# Patient Record
Sex: Male | Born: 1975 | Race: Black or African American | Hispanic: No | Marital: Single | Smoking: Former smoker
Health system: Southern US, Community
[De-identification: ages and names within clinical notes are randomized; demographics above are authoritative.]

## PROBLEM LIST (undated history)

## (undated) DIAGNOSIS — J45909 Unspecified asthma, uncomplicated: Secondary | ICD-10-CM

## (undated) DIAGNOSIS — I10 Essential (primary) hypertension: Secondary | ICD-10-CM

## (undated) HISTORY — DX: Essential (primary) hypertension: I10

## (undated) HISTORY — DX: Unspecified asthma, uncomplicated: J45.909

---

## 1998-02-26 ENCOUNTER — Emergency Department (HOSPITAL_COMMUNITY): Admission: EM | Admit: 1998-02-26 | Discharge: 1998-02-26 | Payer: Self-pay | Admitting: Emergency Medicine

## 1998-04-30 ENCOUNTER — Emergency Department (HOSPITAL_COMMUNITY): Admission: EM | Admit: 1998-04-30 | Discharge: 1998-04-30 | Payer: Self-pay | Admitting: Endocrinology

## 1998-05-06 ENCOUNTER — Emergency Department (HOSPITAL_COMMUNITY): Admission: EM | Admit: 1998-05-06 | Discharge: 1998-05-06 | Payer: Self-pay | Admitting: Emergency Medicine

## 1998-06-17 ENCOUNTER — Other Ambulatory Visit: Admission: RE | Admit: 1998-06-17 | Discharge: 1998-06-17 | Payer: Self-pay | Admitting: Urology

## 1999-03-08 ENCOUNTER — Emergency Department (HOSPITAL_COMMUNITY): Admission: EM | Admit: 1999-03-08 | Discharge: 1999-03-08 | Payer: Self-pay | Admitting: Emergency Medicine

## 1999-03-09 ENCOUNTER — Emergency Department (HOSPITAL_COMMUNITY): Admission: EM | Admit: 1999-03-09 | Discharge: 1999-03-09 | Payer: Self-pay

## 1999-03-10 ENCOUNTER — Emergency Department (HOSPITAL_COMMUNITY): Admission: EM | Admit: 1999-03-10 | Discharge: 1999-03-10 | Payer: Self-pay

## 1999-04-01 ENCOUNTER — Emergency Department (HOSPITAL_COMMUNITY): Admission: EM | Admit: 1999-04-01 | Discharge: 1999-04-01 | Payer: Self-pay

## 1999-07-07 ENCOUNTER — Emergency Department (HOSPITAL_COMMUNITY): Admission: EM | Admit: 1999-07-07 | Discharge: 1999-07-07 | Payer: Self-pay | Admitting: Emergency Medicine

## 1999-10-27 ENCOUNTER — Emergency Department (HOSPITAL_COMMUNITY): Admission: EM | Admit: 1999-10-27 | Discharge: 1999-10-27 | Payer: Self-pay | Admitting: Emergency Medicine

## 1999-11-12 ENCOUNTER — Emergency Department (HOSPITAL_COMMUNITY): Admission: EM | Admit: 1999-11-12 | Discharge: 1999-11-12 | Payer: Self-pay | Admitting: Emergency Medicine

## 1999-12-17 ENCOUNTER — Encounter: Payer: Self-pay | Admitting: Emergency Medicine

## 1999-12-17 ENCOUNTER — Emergency Department (HOSPITAL_COMMUNITY): Admission: EM | Admit: 1999-12-17 | Discharge: 1999-12-18 | Payer: Self-pay | Admitting: Emergency Medicine

## 2000-03-02 ENCOUNTER — Emergency Department (HOSPITAL_COMMUNITY): Admission: EM | Admit: 2000-03-02 | Discharge: 2000-03-02 | Payer: Self-pay | Admitting: Emergency Medicine

## 2003-06-29 ENCOUNTER — Emergency Department (HOSPITAL_COMMUNITY): Admission: EM | Admit: 2003-06-29 | Discharge: 2003-06-29 | Payer: Self-pay | Admitting: Emergency Medicine

## 2003-11-16 ENCOUNTER — Emergency Department (HOSPITAL_COMMUNITY): Admission: EM | Admit: 2003-11-16 | Discharge: 2003-11-16 | Payer: Self-pay | Admitting: Emergency Medicine

## 2004-03-17 ENCOUNTER — Emergency Department (HOSPITAL_COMMUNITY): Admission: EM | Admit: 2004-03-17 | Discharge: 2004-03-17 | Payer: Self-pay | Admitting: Emergency Medicine

## 2004-09-28 ENCOUNTER — Emergency Department (HOSPITAL_COMMUNITY): Admission: EM | Admit: 2004-09-28 | Discharge: 2004-09-28 | Payer: Self-pay | Admitting: Emergency Medicine

## 2004-11-08 ENCOUNTER — Ambulatory Visit: Payer: Self-pay | Admitting: Family Medicine

## 2004-12-09 ENCOUNTER — Ambulatory Visit: Payer: Self-pay | Admitting: Family Medicine

## 2004-12-13 ENCOUNTER — Ambulatory Visit: Payer: Self-pay | Admitting: Family Medicine

## 2004-12-24 ENCOUNTER — Ambulatory Visit: Payer: Self-pay | Admitting: *Deleted

## 2005-01-13 ENCOUNTER — Ambulatory Visit: Payer: Self-pay | Admitting: Family Medicine

## 2005-02-01 ENCOUNTER — Ambulatory Visit: Payer: Self-pay | Admitting: Family Medicine

## 2005-03-22 ENCOUNTER — Emergency Department (HOSPITAL_COMMUNITY): Admission: EM | Admit: 2005-03-22 | Discharge: 2005-03-22 | Payer: Self-pay | Admitting: Emergency Medicine

## 2005-05-26 ENCOUNTER — Ambulatory Visit: Payer: Self-pay | Admitting: Family Medicine

## 2005-05-30 ENCOUNTER — Ambulatory Visit: Payer: Self-pay | Admitting: Family Medicine

## 2006-08-03 ENCOUNTER — Ambulatory Visit: Payer: Self-pay | Admitting: Family Medicine

## 2006-11-29 ENCOUNTER — Ambulatory Visit: Payer: Self-pay | Admitting: Family Medicine

## 2007-08-08 ENCOUNTER — Encounter (INDEPENDENT_AMBULATORY_CARE_PROVIDER_SITE_OTHER): Payer: Self-pay | Admitting: *Deleted

## 2007-12-24 ENCOUNTER — Ambulatory Visit: Payer: Self-pay | Admitting: Family Medicine

## 2007-12-24 LAB — CONVERTED CEMR LAB
Bilirubin Urine: NEGATIVE
Blood in Urine, dipstick: NEGATIVE
Glucose, Urine, Semiquant: NEGATIVE
Nitrite: NEGATIVE
Protein, U semiquant: NEGATIVE
Specific Gravity, Urine: >=1.03
Urobilinogen, UA: NEGATIVE
WBC Urine, dipstick: NEGATIVE
pH: 5.5

## 2007-12-25 ENCOUNTER — Encounter (INDEPENDENT_AMBULATORY_CARE_PROVIDER_SITE_OTHER): Payer: Self-pay | Admitting: Family Medicine

## 2008-12-27 ENCOUNTER — Emergency Department (HOSPITAL_COMMUNITY): Admission: EM | Admit: 2008-12-27 | Discharge: 2008-12-27 | Payer: Self-pay | Admitting: Emergency Medicine

## 2011-02-15 ENCOUNTER — Emergency Department (HOSPITAL_COMMUNITY)
Admission: EM | Admit: 2011-02-15 | Discharge: 2011-02-15 | Disposition: A | Discharge status: Home / Self Care | Payer: Self-pay | Attending: Emergency Medicine | Admitting: Emergency Medicine

## 2011-02-15 DIAGNOSIS — R209 Unspecified disturbances of skin sensation: Secondary | ICD-10-CM | POA: Insufficient documentation

## 2015-01-23 ENCOUNTER — Emergency Department (HOSPITAL_COMMUNITY)
Admission: EM | Admit: 2015-01-23 | Discharge: 2015-01-23 | Disposition: A | Discharge status: Home / Self Care | Payer: Self-pay | Source: Home / Self Care

## 2015-06-07 ENCOUNTER — Ambulatory Visit (INDEPENDENT_AMBULATORY_CARE_PROVIDER_SITE_OTHER): Payer: No Typology Code available for payment source

## 2015-06-07 ENCOUNTER — Ambulatory Visit (INDEPENDENT_AMBULATORY_CARE_PROVIDER_SITE_OTHER): Payer: No Typology Code available for payment source | Admitting: Internal Medicine

## 2015-06-07 VITALS — BP 128/72 | HR 61 | Temp 97.7°F | Resp 18 | Ht 67.0 in | Wt 165.0 lb

## 2015-06-07 DIAGNOSIS — Z87891 Personal history of nicotine dependence: Secondary | ICD-10-CM | POA: Insufficient documentation

## 2015-06-07 DIAGNOSIS — Z114 Encounter for screening for human immunodeficiency virus [HIV]: Secondary | ICD-10-CM

## 2015-06-07 DIAGNOSIS — R1032 Left lower quadrant pain: Secondary | ICD-10-CM

## 2015-06-07 DIAGNOSIS — N419 Inflammatory disease of prostate, unspecified: Secondary | ICD-10-CM | POA: Diagnosis not present

## 2015-06-07 DIAGNOSIS — Z23 Encounter for immunization: Secondary | ICD-10-CM | POA: Diagnosis not present

## 2015-06-07 DIAGNOSIS — I10 Essential (primary) hypertension: Secondary | ICD-10-CM | POA: Insufficient documentation

## 2015-06-07 DIAGNOSIS — R109 Unspecified abdominal pain: Secondary | ICD-10-CM | POA: Diagnosis not present

## 2015-06-07 LAB — POCT URINALYSIS DIPSTICK
Bilirubin, UA: NEGATIVE
Blood, UA: NEGATIVE
Glucose, UA: NEGATIVE
Ketones, UA: NEGATIVE
Leukocytes, UA: NEGATIVE
Nitrite, UA: NEGATIVE
Protein, UA: NEGATIVE
Spec Grav, UA: 1.01
Urobilinogen, UA: 0.2
pH, UA: 6

## 2015-06-07 LAB — POCT CBC
Granulocyte percent: 44.8 %G (ref 37–80)
HCT, POC: 46.3 % (ref 43.5–53.7)
Hemoglobin: 15.6 g/dL (ref 14.1–18.1)
Lymph, poc: 2.6 (ref 0.6–3.4)
MCH, POC: 31 pg (ref 27–31.2)
MCHC: 33.6 g/dL (ref 31.8–35.4)
MCV: 92.3 fL (ref 80–97)
MID (cbc): 0.5 (ref 0–0.9)
MPV: 7.7 fL (ref 0–99.8)
POC Granulocyte: 2.5 (ref 2–6.9)
POC LYMPH PERCENT: 46.7 %L (ref 10–50)
POC MID %: 8.5 %M (ref 0–12)
Platelet Count, POC: 219 10*3/uL (ref 142–424)
RBC: 5.02 M/uL (ref 4.69–6.13)
RDW, POC: 13.7 %
WBC: 5.5 10*3/uL (ref 4.6–10.2)

## 2015-06-07 LAB — POCT UA - MICROSCOPIC ONLY
Bacteria, U Microscopic: NEGATIVE
Casts, Ur, LPF, POC: NEGATIVE
Crystals, Ur, HPF, POC: NEGATIVE
Epithelial cells, urine per micros: NEGATIVE
Mucus, UA: NEGATIVE
RBC, urine, microscopic: NEGATIVE
WBC, Ur, HPF, POC: NEGATIVE
Yeast, UA: NEGATIVE

## 2015-06-07 LAB — HIV ANTIBODY (ROUTINE TESTING W REFLEX): HIV: NONREACTIVE

## 2015-06-07 MED ORDER — SULFAMETHOXAZOLE-TRIMETHOPRIM 800-160 MG PO TABS: 1.0000 | ORAL_TABLET | Freq: Two times a day (BID) | ORAL | Status: AC

## 2015-06-07 NOTE — Progress Notes (Signed)
Patient ID: Nicholas Mata, male    DOB: 1976/03/19, 39 y.o.   MRN: [id number]  PCP: Dorrene German, MD  Subjective:   Chief Complaint  Patient presents with  . Back Pain    lower back pain, 1 month  . Abdominal Pain    1 month     HPI Presents for evaluation of left lower abdominal and left lower back pain.  X 1 month. Too busy with work to come in. About 2 months ago saw PCP with similar complaints and prostate was swollen and given antibiotics. Symptoms resolved. No dysuria, urinary urgency or frequency. No hematuria. No penile discharge. Feels like he has incomplete bladder emptying. Notes that the pain is worse when he needs to have a BM or urinate. No fever, chills, nausea, vomiting or diarrhea. Reports regular BMs and a daily pro-biotic. No unexplained muscle or joint pain.  One sexual partner. Consistent condom use.  No known family history of prostate or colon cancer.   Review of Systems  Constitutional: Negative.   Eyes: Negative for visual disturbance.  Respiratory: Negative for cough, chest tightness and wheezing.   Cardiovascular: Negative for chest pain, palpitations and leg swelling.  Gastrointestinal: Positive for abdominal pain. Negative for nausea, vomiting, diarrhea, constipation, blood in stool, abdominal distention, anal bleeding and rectal pain.  Endocrine: Negative for cold intolerance and polyuria.  Genitourinary: Positive for flank pain. Negative for dysuria, urgency, frequency, hematuria, decreased urine volume, discharge, penile swelling, scrotal swelling, genital sores, penile pain and testicular pain. Difficulty urinating: feels incompletely empties bladder.  Musculoskeletal: Positive for back pain. Negative for myalgias, joint swelling, arthralgias, gait problem, neck pain and neck stiffness.  Skin: Negative for rash and wound.  Neurological: Negative for dizziness, weakness and headaches.  Hematological: Negative for adenopathy. Does not  bruise/bleed easily.  Psychiatric/Behavioral: Negative for sleep disturbance and dysphoric mood. The patient is not nervous/anxious.         Patient Active Problem List   Diagnosis Date Noted  . Benign essential HTN 06/07/2015  . Former smoker 06/07/2015     Prior to Admission medications   Medication Sig Start Date End Date Taking? Authorizing Provider  lisinopril (PRINIVIL,ZESTRIL) 10 MG tablet Take 10 mg by mouth daily.    Historical Provider, MD     No Known Allergies     Objective:  Physical Exam  Constitutional: He is oriented to person, place, and time. Vital signs are normal. He appears well-developed and well-nourished. He is active and cooperative. No distress.  BP 128/72 mmHg  Pulse 61  Temp(Src) 97.7 F (36.5 C) (Oral)  Resp 18  Ht  (1.702 m)  Wt 165 lb (74.844 kg)  BMI 25.84 kg/m2  SpO2 99%  HENT:  Head: Normocephalic and atraumatic.  Right Ear: Hearing normal.  Left Ear: Hearing normal.  Eyes: Conjunctivae are normal. No scleral icterus.  Neck: Normal range of motion. Neck supple. No thyromegaly present.  Cardiovascular: Normal rate, regular rhythm and normal heart sounds.   Pulses:      Radial pulses are 2+ on the right side, and 2+ on the left side.  Pulmonary/Chest: Effort normal and breath sounds normal.  Abdominal: Soft. Normal appearance. Bowel sounds are increased. There is no hepatosplenomegaly. There is generalized tenderness. There is no rigidity, no rebound, no guarding, no CVA tenderness, no tenderness at McBurney's point and negative Murphy's sign. No hernia. Hernia confirmed negative in the right inguinal area and confirmed negative in the left inguinal area.  Genitourinary: Rectum normal, testes normal and penis normal. Prostate is tender. Prostate is not enlarged. Circumcised.  Musculoskeletal:       Cervical back: Normal.       Thoracic back: Normal.       Lumbar back: Normal.  Lymphadenopathy:       Head (right side): No  tonsillar, no preauricular, no posterior auricular and no occipital adenopathy present.       Head (left side): No tonsillar, no preauricular, no posterior auricular and no occipital adenopathy present.    He has no cervical adenopathy.       Right: No inguinal and no supraclavicular adenopathy present.       Left: No inguinal and no supraclavicular adenopathy present.  Neurological: He is alert and oriented to person, place, and time. No sensory deficit.  Skin: Skin is warm, dry and intact. No rash noted. No cyanosis or erythema. Nails show no clubbing.  Psychiatric: He has a normal mood and affect. His speech is normal and behavior is normal.       Results for orders placed or performed in visit on 06/07/15  POCT CBC  Result Value Ref Range   WBC 5.5 4.6 - 10.2 K/uL   Lymph, poc 2.6 0.6 - 3.4   POC LYMPH PERCENT 46.7 10 - 50 %L   MID (cbc) 0.5 0 - 0.9   POC MID % 8.5 0 - 12 %M   POC Granulocyte 2.5 2 - 6.9   Granulocyte percent 44.8 37 - 80 %G   RBC 5.02 4.69 - 6.13 M/uL   Hemoglobin 15.6 14.1 - 18.1 g/dL   HCT, POC 16.1 09.6 - 53.7 %   MCV 92.3 80 - 97 fL   MCH, POC 31.0 27 - 31.2 pg   MCHC 33.6 31.8 - 35.4 g/dL   RDW, POC 04.5 %   Platelet Count, POC 219 142 - 424 K/uL   MPV 7.7 0 - 99.8 fL  POCT UA - Microscopic Only  Result Value Ref Range   WBC, Ur, HPF, POC neg    RBC, urine, microscopic neg    Bacteria, U Microscopic neg    Mucus, UA neg    Epithelial cells, urine per micros neg    Crystals, Ur, HPF, POC neg    Casts, Ur, LPF, POC neg    Yeast, UA neg   POCT urinalysis dipstick  Result Value Ref Range   Color, UA yellow    Clarity, UA clear    Glucose, UA neg    Bilirubin, UA neg    Ketones, UA neg    Spec Grav, UA 1.010    Blood, UA neg    pH, UA 6.0    Protein, UA neg    Urobilinogen, UA 0.2    Nitrite, UA neg    Leukocytes, UA Negative Negative   Acute Abdominal Series: [facility] reading (PRIMARY) by  Dr. Merla Riches. Increase air in the colon,  especially LUQ. Non-specific bowel gas pattern. No free air. No evidence of mass or obstruction.      Assessment & Plan:   1. Flank pain 2. Abdominal pain, LLQ (left lower quadrant) Doubt this is acute prostatitis, but given prostate tenderness, treat as below. Suspect chronic subacute constipation causing pain and preventing complete bladder emptying. Miralax daily this week, and if stool not very loose by Friday, Miralax clean out. If symptoms persist, or resolve and recur, would recommend GI evaluation. - POCT UA - Microscopic Only - POCT urinalysis dipstick -  POCT CBC - DG Abd Acute W/Chest - GC/Chlamydia Probe Amp  3. Screening for HIV (human immunodeficiency virus) - HIV antibody  4. Need for Tdap vaccination - Tdap vaccine greater than or equal to 7yo IM  5. Prostatitis, unspecified prostatitis type UA normal, but prostate is tender. Not enlarged today but reportedly enlarged previously with similar symptoms. Consider BPH and the addition of Flomax. - sulfamethoxazole-trimethoprim (BACTRIM DS,SEPTRA DS) 800-160 MG per tablet; Take 1 tablet by mouth 2 (two) times daily.  Dispense: 28 tablet; Refill: 0   Fernande Brashelle S. Adisa Litt, PA-C Physician Assistant-Certified [facility]  I have participated in the care of this patient with the Advanced Practice Provider and agree with Diagnosis and Plan as documented. Robert P. Merla Richesoolittle, M.D.

## 2015-06-07 NOTE — Patient Instructions (Signed)
HOW TO GET YOUR KID UNCLOGGED ASAP (copied from It's No Accident by Dr. Yetta FlockHodges, pp. [id number])  Initial Clean Out: Start this process on a Friday night, so you have the entire weekend to get the job done. For children lighter than 45 pounds, mix seven doses of MiraLAX or generic brand powder (one dose is a cap filled to the line) in 32 ounces of Gatorade, Pedialyte, or other clear, noncarbonated liquid. Gatorade and Pedialyte help prevent dehydration because they contain electrolytes.  Plus, they taste good, so kids may be more motivated to drink the full dose.  Have your child drink the entire bottle over twenty-four hours. She can eat anything she wants, but should drink only this fluid. Kids weighing between forty-five and eighty pounds should get ten doses, and those weighing at least eighty pounds should receive fourteen doses of laxative in 64 ounces of fluid over the first twenty-four hours.  Don't give her less than this amount! You may even need to give her more.  The endpoint you're shooting for is watery poop.  Your child should pass five or six stools within twenty-four to forty-eight hours.  If she doesn't, keep her on the maintenance dose described below and try the initial clean out program again the next weekend, keeping her on a daily capful of MiraLAX during the week.  Maintenance: Following the bowel clearing, give your child one cap of the laxative in 8 ounces of fluid daily, and have her sit on the toilet after all meals, especially breakfast, using a footstool for support if necessary.  She should blow out while pooping, which will help her bottom muscles relax.

## 2015-06-10 LAB — GC/CHLAMYDIA PROBE AMP
CT Probe RNA: NEGATIVE
GC Probe RNA: NEGATIVE

## 2016-03-04 ENCOUNTER — Ambulatory Visit (INDEPENDENT_AMBULATORY_CARE_PROVIDER_SITE_OTHER): Payer: 59 | Admitting: Physician Assistant

## 2016-03-04 ENCOUNTER — Ambulatory Visit (INDEPENDENT_AMBULATORY_CARE_PROVIDER_SITE_OTHER): Payer: 59

## 2016-03-04 ENCOUNTER — Other Ambulatory Visit: Payer: Self-pay | Admitting: Physician Assistant

## 2016-03-04 VITALS — BP 142/102 | HR 63 | Temp 98.0°F | Resp 16 | Ht 67.0 in | Wt 167.6 lb

## 2016-03-04 DIAGNOSIS — R1032 Left lower quadrant pain: Secondary | ICD-10-CM

## 2016-03-04 DIAGNOSIS — R3989 Other symptoms and signs involving the genitourinary system: Secondary | ICD-10-CM

## 2016-03-04 DIAGNOSIS — K5900 Constipation, unspecified: Secondary | ICD-10-CM

## 2016-03-04 DIAGNOSIS — I10 Essential (primary) hypertension: Secondary | ICD-10-CM

## 2016-03-04 DIAGNOSIS — N419 Inflammatory disease of prostate, unspecified: Secondary | ICD-10-CM

## 2016-03-04 DIAGNOSIS — N3289 Other specified disorders of bladder: Secondary | ICD-10-CM

## 2016-03-04 LAB — POCT URINALYSIS DIP (MANUAL ENTRY)
Bilirubin, UA: NEGATIVE
Blood, UA: NEGATIVE
GLUCOSE UA: NEGATIVE
Leukocytes, UA: NEGATIVE
NITRITE UA: NEGATIVE
Protein Ur, POC: NEGATIVE
Spec Grav, UA: 1.02
Urobilinogen, UA: 0.2
pH, UA: 6

## 2016-03-04 LAB — CBC WITH DIFFERENTIAL/PLATELET
BASOS PCT: 1 %
Basophils Absolute: 56 cells/uL (ref 0–200)
EOS ABS: 224 {cells}/uL (ref 15–500)
Eosinophils Relative: 4 %
HEMATOCRIT: 47.9 % (ref 38.5–50.0)
HEMOGLOBIN: 16 g/dL (ref 13.2–17.1)
LYMPHS ABS: 1960 {cells}/uL (ref 850–3900)
LYMPHS PCT: 35 %
MCH: 31.9 pg (ref 27.0–33.0)
MCHC: 33.4 g/dL (ref 32.0–36.0)
MCV: 95.6 fL (ref 80.0–100.0)
MONO ABS: 784 {cells}/uL (ref 200–950)
MPV: 10.2 fL (ref 7.5–12.5)
Monocytes Relative: 14 %
Neutro Abs: 2576 cells/uL (ref 1500–7800)
Neutrophils Relative %: 46 %
Platelets: 239 10*3/uL (ref 140–400)
RBC: 5.01 MIL/uL (ref 4.20–5.80)
RDW: 13.9 % (ref 11.0–15.0)
WBC: 5.6 10*3/uL (ref 3.8–10.8)

## 2016-03-04 MED ORDER — SULFAMETHOXAZOLE-TRIMETHOPRIM 800-160 MG PO TABS: 1.0000 | ORAL_TABLET | Freq: Two times a day (BID) | ORAL | Status: AC

## 2016-03-04 MED ORDER — LISINOPRIL 10 MG PO TABS: 10.0000 mg | ORAL_TABLET | Freq: Every day | ORAL | Status: DC

## 2016-03-04 NOTE — Patient Instructions (Addendum)
Metamucil - daily fiber to help with constipation  If your abd pain     IF you received an x-ray today, you will receive an invoice from [facility]. Please contact [facility] at [phone] with questions or concerns regarding your invoice.   IF you received labwork today, you will receive an invoice from [facility]. Please contact [facility] at [phone] with questions or concerns regarding your invoice.   Our billing staff will not be able to assist you with questions regarding bills from these companies.  You will be contacted with the lab results as soon as they are available. The fastest way to get your results is to activate your My Chart account. Instructions are located on the last page of this paperwork. If you have not heard from us regarding the results in 2 weeks, please contact this office.

## 2016-03-04 NOTE — Progress Notes (Signed)
Nicholas PersonsLevon R Mata  MRN: [id number] DOB: [birth year]  Subjective:  Pt presents to clinic with similar symptoms that he had 05/2015 for the last 6 days, back pain and bladder pressure.  He feels the same as when his symptoms started 6 days ago.   He has had problems with constipation over the last week and the back pain seems related to that.  When he has a BM the back pain resolves and then slowly comes back.  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   Over the last 6 days he has had a different sensation when he has a full bladder.  His stream is normal but there is a pressure in the bladder and behind it.  When he has the pressure he gets a slight amount of LLQ pain that is relieved after he urinates.  He is having no testicular pain.  No F/C, no dysuria or urinary frequency.  No new sexual partners.  No change in urine stream or hesitancy.  No penile discharge.  He has known HTN and he has medication at home but he does not use the medication.  Patient Active Problem List   Diagnosis Date Noted  . Benign essential HTN 06/07/2015  . Former smoker 06/07/2015    No current outpatient prescriptions on file prior to visit.   No current facility-administered medications on file prior to visit.    No Known Allergies  Review of Systems  Constitutional: Negative for fever and chills.  Gastrointestinal: Positive for abdominal pain (LLQ pain - relieved with urinartion and a BM) and constipation.  Genitourinary: Negative for dysuria, frequency, discharge, scrotal swelling and testicular pain.   Objective:  BP 142/102 mmHg  Pulse 63  Temp(Src) 98 F (36.7 C) (Oral)  Resp 16  Ht 5\' 7"  (1.702 m)  Wt 167 lb 9.6 oz (76.023 kg)  BMI 26.24 kg/m2  SpO2 99%  Physical Exam  Constitutional: He is oriented to person, place, and time and well-developed, well-nourished, and in no distress.  HENT:  Head: Normocephalic and atraumatic.  Right Ear:  External ear normal.  Left Ear: External ear normal.  Eyes: Conjunctivae are normal.  Neck: Normal range of motion.  Cardiovascular: Normal rate, regular rhythm and normal heart sounds.   No murmur heard. Pulmonary/Chest: Effort normal and breath sounds normal. He has no wheezes.  Genitourinary: Testes/scrotum normal. Prostate is enlarged and tender (mild). He exhibits no abnormal testicular mass, no testicular tenderness, no abnormal scrotal mass, no scrotal tenderness and no epididymal tenderness.  Neurological: He is alert and oriented to person, place, and time. Gait normal.  Skin: Skin is warm and dry.  Psychiatric: Mood, memory, affect and judgment normal.   Dg Abd 1 View  03/04/2016  CLINICAL DATA:  Left lower quadrant pain . EXAM: ABDOMEN - 1 VIEW COMPARISON:  06/07/2015. FINDINGS: Soft tissue structures are unremarkable. Dilated air-filled loops of small bowel noted. Colonic gas pattern is normal. These findings suggest possibility of partial small bowel obstruction. Follow-up exam suggested for continued evaluation. No acute bony abnormality. IMPRESSION: Dilated loops of small bowel. Colonic gas pattern is normal. These findings suggest partial small bowel obstruction. Follow-up exam suggested for continued evaluation. Electronically Signed   By: Maisie Fushomas  Register   On: 03/04/2016 09:54    Assessment and Plan :  LLQ pain - Plan: DG Abd 1 View, COMPLETE METABOLIC PANEL WITH GFR, Care order/instruction - pt will be  mindful of his pain and if it changes - if this does not improve he will need to be recheck due to imaging results of possible early small bowel obstruction  Sensation of pressure in bladder area - Plan: GC/Chlamydia Probe Amp, PSA, CBC with Differential/Platelet, POCT urinalysis dipstick - will treat for prostatitis while waiting on lab results to return  Prostatitis, unspecified prostatitis type - Plan: sulfamethoxazole-trimethoprim (BACTRIM DS,SEPTRA DS) 800-160 MG  tablet  Constipation, unspecified constipation type - he will start a daily fiber supplement - it is interesting that his symptoms seem to start with constipation so we will work on getting this from happening in the future  Essential hypertension - Plan: lisinopril (PRINIVIL,ZESTRIL) 10 MG tablet - pt will start back on his BP medication and recheck in a month to make sure it is stable on the medication that he has been on in the past.  Nicholas Lennert PA-C  [facility] 03/04/2016 11:32 AM

## 2016-03-05 LAB — COMPLETE METABOLIC PANEL WITH GFR
ALT: 33 U/L (ref 9–46)
AST: 38 U/L (ref 10–40)
Albumin: 4.6 g/dL (ref 3.6–5.1)
Alkaline Phosphatase: 47 U/L (ref 40–115)
BUN: 17 mg/dL (ref 7–25)
CALCIUM: 9.4 mg/dL (ref 8.6–10.3)
CHLORIDE: 102 mmol/L (ref 98–110)
CO2: 31 mmol/L (ref 20–31)
CREATININE: 1.05 mg/dL (ref 0.60–1.35)
GFR, EST NON AFRICAN AMERICAN: 89 mL/min (ref >=60)
Glucose, Bld: 68 mg/dL (ref 65–99)
POTASSIUM: 4.6 mmol/L (ref 3.5–5.3)
Sodium: 139 mmol/L (ref 135–146)
Total Bilirubin: 0.6 mg/dL (ref 0.2–1.2)
Total Protein: 7.9 g/dL (ref 6.1–8.1)

## 2016-03-05 LAB — GC/CHLAMYDIA PROBE AMP
CT Probe RNA: NOT DETECTED
GC Probe RNA: NOT DETECTED

## 2016-03-05 LAB — PSA: PSA: 1.29 ng/mL (ref <=4.00)

## 2016-09-13 ENCOUNTER — Encounter: Payer: Self-pay | Admitting: Physician Assistant

## 2016-09-13 ENCOUNTER — Ambulatory Visit (INDEPENDENT_AMBULATORY_CARE_PROVIDER_SITE_OTHER): Payer: 59 | Admitting: Physician Assistant

## 2016-09-13 VITALS — BP 122/72 | HR 66 | Temp 98.4°F | Resp 17 | Ht 67.0 in | Wt 164.0 lb

## 2016-09-13 DIAGNOSIS — I10 Essential (primary) hypertension: Secondary | ICD-10-CM

## 2016-09-13 DIAGNOSIS — J029 Acute pharyngitis, unspecified: Secondary | ICD-10-CM

## 2016-09-13 MED ORDER — AMOXICILLIN 875 MG PO TABS: 875.0000 mg | ORAL_TABLET | Freq: Two times a day (BID) | ORAL | 0 refills | Status: DC

## 2016-09-13 MED ORDER — LISINOPRIL 10 MG PO TABS: 10.0000 mg | ORAL_TABLET | Freq: Every day | ORAL | 1 refills | Status: DC

## 2016-09-13 NOTE — Progress Notes (Signed)
By signing my name below, I, Nicholas Mata, attest that this documentation has been prepared under the direction and in the presence of Deliah Boston, PA-C. Electronically Signed: Stann Mata, Scribe. 09/14/2016 , 6:07 PM .  Patient was seen in Room 10 .  09/14/2016 1:40 PM   DOB: 1976/05/26 / MRN: [id number]  SUBJECTIVE:  Nicholas Mata is a 40 y.o. male presenting for sore throat with swollen left sided lymph node that started 4 days ago. He thought it was just a cold, and tried taking theraflu without relief. 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 He also has a slight cough. He informs rhinorrhea last week, but this has resolved. He denies nasal congestion, sneezing, or itchy eyes.   He also requests medication refill of his lisinopril. His PCP is Dorrene German, MD, but hasn't been there for a while.   He is a former smoker, quit around in 2009.   He has No Known Allergies.   He  has a past medical history of Asthma and Hypertension.    He  reports that he quit smoking about 2 months ago. He has never used smokeless tobacco. He reports that he does not drink alcohol or use drugs. He  reports that he does not engage in sexual activity. The patient  has no past surgical history on file.  His family history includes Eczema in his daughter.  Review of Systems  Constitutional: Negative for fever.  HENT: Positive for sore throat. Negative for congestion.   Respiratory: Positive for cough. Negative for shortness of breath and wheezing.   Cardiovascular: Negative for chest pain.  Skin: Negative for itching and rash.    The problem list and medications were reviewed and updated by myself where necessary and exist elsewhere in the encounter.   OBJECTIVE:  BP 122/72 (BP Location: Right Arm, Patient Position: Sitting, Cuff Size: Normal)    Pulse 66    Temp 98.4 F (36.9 C) (Oral)    Resp 17    Ht 5\' 7"  (1.702 m)    Wt  164 lb (74.4 kg)    SpO2 98%    BMI 25.69 kg/m   Physical Exam  Constitutional: He is oriented to person, place, and time. Vital signs are normal. He appears well-developed.  HENT:  Right Ear: Tympanic membrane normal.  Left Ear: Tympanic membrane normal.  Nose: Mucosal edema (turbinates swelling on right side) present.  Mouth/Throat: Posterior oropharyngeal edema (slight swelling on left side) and posterior oropharyngeal erythema (minimal) present.  Cardiovascular: Normal rate, regular rhythm and normal heart sounds.   No murmur heard. Pulmonary/Chest: Effort normal and breath sounds normal. No respiratory distress.  Musculoskeletal: Normal range of motion.  Lymphadenopathy:       Head (left side): Tonsillar adenopathy present.    He has no cervical adenopathy.  Neurological: He is alert and oriented to person, place, and time.  Skin: Skin is warm and dry.  Psychiatric: He has a normal mood and affect. His behavior is normal. Judgment and thought content normal.    No results found for this or any previous visit (from the past 72 hour(s)).  No results found.  ASSESSMENT AND PLAN  [name] was seen today for sore throat and medication refill.  Diagnoses and all orders for this visit:  Benign essential HTN -     COMPLETE METABOLIC PANEL WITH GFR  Sore throat: He wants to go  ahead and treat while waiting for culture results.  Advised this is not unresonable to do given he is only complaining of sore throat and lack cough and other uri symptoms. Will contact him with culture results.   -     Culture, Group A Strep -     amoxicillin (AMOXIL) 875 MG tablet; Take 1 tablet (875 mg total) by mouth 2 (two) times daily.  Essential hypertension -     lisinopril (PRINIVIL,ZESTRIL) 10 MG tablet; Take 1 tablet (10 mg total) by mouth daily.     The patient is advised to call or return to clinic if he does not see an improvement in symptoms, or to seek the care of the closest emergency  department if he worsens with the above plan.   This note was scribed in my presence and I performed the services described in the this documentation.   Deliah BostonMichael Clark, MHS, PA-C [facility] 09/14/2016 1:40 PM

## 2016-09-14 LAB — COMPLETE METABOLIC PANEL WITH GFR
ALT: 23 U/L (ref 9–46)
AST: 31 U/L (ref 10–40)
Albumin: 4 g/dL (ref 3.6–5.1)
Alkaline Phosphatase: 46 U/L (ref 40–115)
BILIRUBIN TOTAL: 0.3 mg/dL (ref 0.2–1.2)
BUN: 12 mg/dL (ref 7–25)
CHLORIDE: 104 mmol/L (ref 98–110)
CO2: 27 mmol/L (ref 20–31)
CREATININE: 1.03 mg/dL (ref 0.60–1.35)
Calcium: 8.8 mg/dL (ref 8.6–10.3)
GFR, Est Non African American: >89 mL/min (ref >=60)
GLUCOSE: 96 mg/dL (ref 65–99)
Potassium: 4.9 mmol/L (ref 3.5–5.3)
SODIUM: 138 mmol/L (ref 135–146)
TOTAL PROTEIN: 6.8 g/dL (ref 6.1–8.1)

## 2016-09-15 LAB — CULTURE, GROUP A STREP: Organism ID, Bacteria: NORMAL

## 2016-09-15 NOTE — Progress Notes (Signed)
Please advise that he stop the amox.  Culture is negative. Deliah BostonMichael Clark, MS, PA-C 10:31 AM, 09/15/2016

## 2016-10-13 ENCOUNTER — Encounter (HOSPITAL_COMMUNITY): Payer: Self-pay | Admitting: Nurse Practitioner

## 2016-10-13 ENCOUNTER — Emergency Department (HOSPITAL_COMMUNITY)
Admission: EM | Admit: 2016-10-13 | Discharge: 2016-10-13 | Disposition: A | Discharge status: Home / Self Care | Payer: 59 | Attending: Emergency Medicine | Admitting: Emergency Medicine

## 2016-10-13 DIAGNOSIS — J029 Acute pharyngitis, unspecified: Secondary | ICD-10-CM | POA: Diagnosis not present

## 2016-10-13 DIAGNOSIS — Z87891 Personal history of nicotine dependence: Secondary | ICD-10-CM | POA: Diagnosis not present

## 2016-10-13 DIAGNOSIS — I10 Essential (primary) hypertension: Secondary | ICD-10-CM | POA: Diagnosis not present

## 2016-10-13 DIAGNOSIS — J45909 Unspecified asthma, uncomplicated: Secondary | ICD-10-CM | POA: Diagnosis not present

## 2016-10-13 DIAGNOSIS — Z79899 Other long term (current) drug therapy: Secondary | ICD-10-CM | POA: Diagnosis not present

## 2016-10-13 LAB — RAPID STREP SCREEN (MED CTR MEBANE ONLY): Streptococcus, Group A Screen (Direct): NEGATIVE

## 2016-10-13 NOTE — ED Provider Notes (Signed)
[facility]-EMERGENCY DEPT Provider Note   CSN: [id number] Arrival date & time: 10/13/16  0055     History   Chief Complaint Chief Complaint  Patient presents with  . Sore Throat    HPI Nicholas Mata is a 40 y.o. male.  Sore throat causing pain with swallowing since last night. No fever, cough, congestion or nausea. He states he ate shrimp the night before and was concerned he was having a reaction to seafood. He denies previous reactions.    The history is provided by the patient. No language interpreter was used.  Sore Throat  Pertinent negatives include no chest pain and no shortness of breath.    Past Medical History:  Diagnosis Date  . Asthma   . Hypertension     Patient Active Problem List   Diagnosis Date Noted  . Benign essential HTN 06/07/2015  . Former smoker 06/07/2015    History reviewed. No pertinent surgical history.  OB History    No data available       Home Medications    Prior to Admission medications   Medication Sig Start Date End Date Taking? Authorizing Provider  amoxicillin (AMOXIL) 875 MG tablet Take 1 tablet (875 mg total) by mouth 2 (two) times daily. 09/13/16   Ofilia NeasMichael L Clark, PA-C  lisinopril (PRINIVIL,ZESTRIL) 10 MG tablet Take 1 tablet (10 mg total) by mouth daily. 09/13/16   Ofilia NeasMichael L Clark, PA-C    Family History Family History  Problem Relation Age of Onset  . Eczema Daughter     Social History Social History  Substance Use Topics  . Smoking status: Former Smoker    Quit date: 07/14/2016  . Smokeless tobacco: Never Used  . Alcohol use No     Allergies   Patient has no known allergies.   Review of Systems Review of Systems  Constitutional: Negative for chills and fever.  HENT: Positive for sore throat and trouble swallowing. Negative for congestion.   Respiratory: Negative.  Negative for cough and shortness of breath.   Cardiovascular: Negative.  Negative for chest pain.  Gastrointestinal: Negative.  Negative  for nausea.  Musculoskeletal: Negative.  Negative for myalgias.  Skin: Negative.  Negative for rash.  Neurological: Negative.      Physical Exam Updated Vital Signs BP (!) 164/106 (BP Location: Left Arm)   Pulse (!) 59   Temp 98.1 F (36.7 C) (Oral)   Resp 20   Ht 5\' 6"  (1.676 m)   Wt 72.6 kg   SpO2 99%   BMI 25.82 kg/m   Physical Exam  Constitutional: He is oriented to person, place, and time. He appears well-developed and well-nourished.  HENT:  Head: Normocephalic.  Oropharyngeal erythema without exudates or significant swelling. Uvula is midline.  Neck: Normal range of motion. Neck supple.  Cardiovascular: Normal rate and regular rhythm.   No murmur heard. Pulmonary/Chest: Effort normal and breath sounds normal. He has no wheezes. He has no rales.  Abdominal: Soft. Bowel sounds are normal. There is no tenderness. There is no rebound and no guarding.  Musculoskeletal: Normal range of motion.  Lymphadenopathy:    He has no cervical adenopathy.  Neurological: He is alert and oriented to person, place, and time.  Skin: Skin is warm and dry. No rash noted.  Psychiatric: He has a normal mood and affect.     ED Treatments / Results  Labs (all labs ordered are listed, but only abnormal results are displayed) Labs Reviewed  RAPID STREP SCREEN (NOT  AT [facility])  CULTURE, GROUP A STREP [facility])   Results for orders placed or performed during the hospital encounter of 10/13/16  Rapid strep screen  Result Value Ref Range   Streptococcus, Group A Screen (Direct) NEGATIVE NEGATIVE    EKG  EKG Interpretation None       Radiology No results found.  Procedures Procedures (including critical care time)  Medications Ordered in ED Medications - No data to display   Initial Impression / Assessment and Plan / ED Course  I have reviewed the triage vital signs and the nursing notes.  Pertinent labs & imaging results that were available during my care of the patient were  reviewed by me and considered in my medical decision making (see chart for details).  Clinical Course     Patient with sore throat x 1 day. No fever. He states difficulty in swallowing but is able to manage secretions here and appears in NAD. VSS. Strep negative.   He voiced a concern for allergic reaction to shrimp eaten the night (24 hours) before. Patient reassured current symptoms no due to allergic reaction.  Final Clinical Impressions(s) / ED Diagnoses   Final diagnoses:  None   1. Pharyngitis  New Prescriptions New Prescriptions   No medications on file     Elpidio AnisShari Lovel Suazo, PA-C 10/13/16 0341    Devoria AlbeIva Knapp, MD 10/13/16 [phone]

## 2016-10-13 NOTE — ED Triage Notes (Signed)
Pt is complaining of sore throat with difficulty swallowing. Denies fever or chills.

## 2016-10-15 ENCOUNTER — Ambulatory Visit (INDEPENDENT_AMBULATORY_CARE_PROVIDER_SITE_OTHER): Payer: 59 | Admitting: Family Medicine

## 2016-10-15 ENCOUNTER — Ambulatory Visit (INDEPENDENT_AMBULATORY_CARE_PROVIDER_SITE_OTHER): Payer: 59

## 2016-10-15 VITALS — BP 148/88 | HR 72 | Temp 98.3°F | Resp 16 | Ht 65.5 in | Wt 161.2 lb

## 2016-10-15 DIAGNOSIS — S63502A Unspecified sprain of left wrist, initial encounter: Secondary | ICD-10-CM

## 2016-10-15 DIAGNOSIS — R05 Cough: Secondary | ICD-10-CM

## 2016-10-15 DIAGNOSIS — M549 Dorsalgia, unspecified: Secondary | ICD-10-CM

## 2016-10-15 DIAGNOSIS — R059 Cough, unspecified: Secondary | ICD-10-CM

## 2016-10-15 LAB — CULTURE, GROUP A STREP (THRC)

## 2016-10-15 NOTE — Patient Instructions (Addendum)
You likely had a sprain of your wrist. See information on this below. You can use the wrist brace as needed, but try to come out of that at least once or twice a day for range of motion, and use that brace only as needed.   Upper back pain appears to be due to muscle pain, which may have been due to a strain with cough or other muscle issue. Over-the-counter Tylenol, Advil, or Aleve as needed. Heat or ice, and range of motion as tolerated.   If you have increasing shortness of breath, calf pain or swelling, or chest pain, be seen immediately.   Back Pain, Adult Back pain is very common in adults.The cause of back pain is rarely dangerous and the pain often gets better over time.The cause of your back pain may not be known. Some common causes of back pain include:  Strain of the muscles or ligaments supporting the spine.  Wear and tear (degeneration) of the spinal disks.  Arthritis.  Direct injury to the back. For many people, back pain may return. Since back pain is rarely dangerous, most people can learn to manage this condition on their own. Follow these instructions at home: Watch your back pain for any changes. The following actions may help to lessen any discomfort you are feeling:  Remain active. It is stressful on your back to sit or stand in one place for long periods of time. Do not sit, drive, or stand in one place for more than 30 minutes at a time. Take short walks on even surfaces as soon as you are able.Try to increase the length of time you walk each day.  Exercise regularly as directed by your health care provider. Exercise helps your back heal faster. It also helps avoid future injury by keeping your muscles strong and flexible.  Do not stay in bed.Resting more than 1-2 days can delay your recovery.  Pay attention to your body when you bend and lift. The most comfortable positions are those that put less stress on your recovering back. Always use proper lifting  techniques, including:  Bending your knees.  Keeping the load close to your body.  Avoiding twisting.  Find a comfortable position to sleep. Use a firm mattress and lie on your side with your knees slightly bent. If you lie on your back, put a pillow under your knees.  Avoid feeling anxious or stressed.Stress increases muscle tension and can worsen back pain.It is important to recognize when you are anxious or stressed and learn ways to manage it, such as with exercise.  Take medicines only as directed by your health care provider. Over-the-counter medicines to reduce pain and inflammation are often the most helpful.Your health care provider may prescribe muscle relaxant drugs.These medicines help dull your pain so you can more quickly return to your normal activities and healthy exercise.  Apply ice to the injured area:  Put ice in a plastic bag.  Place a towel between your skin and the bag.  Leave the ice on for 20 minutes, 2-3 times a day for the first 2-3 days. After that, ice and heat may be alternated to reduce pain and spasms.  Maintain a healthy weight. Excess weight puts extra stress on your back and makes it difficult to maintain good posture. Contact a health care provider if:  You have pain that is not relieved with rest or medicine.  You have increasing pain going down into the legs or buttocks.  You have pain  that does not improve in one week.  You have night pain.  You lose weight.  You have a fever or chills. Get help right away if:  You develop new bowel or bladder control problems.  You have unusual weakness or numbness in your arms or legs.  You develop nausea or vomiting.  You develop abdominal pain.  You feel faint. This information is not intended to replace advice given to you by your health care provider. Make sure you discuss any questions you have with your health care provider. Document Released: 11/07/2005 Document Revised: 03/17/2016  Document Reviewed: 03/11/2014 Elsevier Interactive Patient Education  2017 [employer]  Wrist Sprain, Adult A wrist sprain is a stretch or tear in the strong, fibrous tissues (ligaments) that connect your wrist bones. There are three types of wrist sprains:  Grade 1. In this type of sprain, the ligament is stretched more than normal.  Grade 2. In this type of sprain, the ligament is partially torn. You may be able to move your wrist, but not very much.  Grade 3. In this type of sprain, the ligament or muscle is completely torn. You may find it difficult or extremely painful to move your wrist even a little. What are the causes? A wrist sprain can be caused by using the wrist too much during sports, exercise, or at work. It can also happen with a fall or during an accident. What increases the risk? This condition is more likely to occur in people:  With a previous wrist or arm injury.  With poor wrist strength and flexibility.  Who play contact sports, such as football or soccer.  Who play sports that may result in a fall, such as skateboarding, biking, skiing, or snowboarding.  Who do not exercise regularly.  Who use exercise equipment that does not fit well. What are the signs or symptoms? Symptoms of this condition include:  Pain in the wrist, arm, or hand.  Swelling or bruised skin near the wrist, hand, or arm. The skin may look yellow or kind of blue.  Stiffness or trouble moving the hand.  Hearing a pop or feeling a tear at the time of the injury.  A warm feeling in the skin around the wrist. How is this diagnosed? This condition is diagnosed with a physical exam. Sometimes an X-ray is taken to make sure a bone did not break. If your health care provider thinks that you tore a ligament, he or she may order an MRI of your wrist. How is this treated? This condition is treated by resting and applying ice to your wrist. Additional treatment may include:  Medicine for  pain and inflammation.  A splint to keep your wrist still (immobilized).  Exercises to strengthen and stretch your wrist.  Surgery. This may be done if the ligament is completely torn. Follow these instructions at home: If you have a splint:  Do not put pressure on any part of the splint until it is fully hardened. This may take several hours.  Wear the splint as told by your health care provider. Remove it only as told by your health care provider.  Loosen the splint if your fingers tingle, become numb, or turn cold and blue.  If your splint is not waterproof:  Do not let it get wet.  Cover it with a watertight covering when you take a bath or a shower.  Keep the splint clean. Managing pain, stiffness, and swelling  If directed, put ice on the  injured area.  If you have a removable splint, remove it as told by your health care provider.  Put ice in a plastic bag.  Place a towel between your skin and the bag or between the splint and the bag.  Leave the ice on for 20 minutes, 2-3 times per day.  Move your fingers often to avoid stiffness and to lessen swelling.  Raise (elevate) the injured area above the level of your heart while you are sitting or lying down. Activity  Rest your wrist. Do not do things that cause pain.  Return to your normal activities as told by your health care provider. Ask your health care provider what activities are safe for you.  Do exercises as told by your health care provider. General instructions  Take over-the-counter and prescription medicines only as told by your health care provider.  Do not use any products that contain nicotine or tobacco, such as cigarettes and e-cigarettes. These can delay healing. If you need help quitting, ask your health care provider.  Ask your health care provider when it is safe to drive if you have a splint.  Keep all follow-up visits as told by your health care provider. This is important. Contact a  health care provider if:  Your pain, bruising, or swelling gets worse.  Your skin becomes red, gets a rash, or has open sores.  Your pain does not get better or it gets worse. Get help right away if:  You have a new or sudden sharp pain in the hand, arm, or wrist.  You have tingling or numbness in your hand.  Your fingers turn white, very red, or cold and blue.  You cannot move your fingers. This information is not intended to replace advice given to you by your health care provider. Make sure you discuss any questions you have with your health care provider. Document Released: 07/11/2014 Document Revised: 06/04/2016 Document Reviewed: 05/26/2016 [employer] Interactive Patient Education  2017 [employer]   IF you received an x-ray today, you will receive an invoice from [facility]. Please contact [facility] at [phone] with questions or concerns regarding your invoice.   IF you received labwork today, you will receive an invoice from [facility]. Please contact [facility] at [phone] with questions or concerns regarding your invoice.   Our billing staff will not be able to assist you with questions regarding bills from these companies.  You will be contacted with the lab results as soon as they are available. The fastest way to get your results is to activate your My Chart account. Instructions are located on the last page of this paperwork. If you have not heard from us regarding the results in 2 weeks, please contact this office.

## 2016-10-15 NOTE — Progress Notes (Signed)
Subjective:  By signing my name below, I, Stann Oresung-Kai Tsai, attest that this documentation has been prepared under the direction and in the presence of Meredith StaggersJeffrey Greene, MD. Electronically Signed: Stann Oresung-Kai Tsai, Scribe. 10/15/2016 , 8:25 AM .  Patient was seen in Room 11 .   Patient ID: Nicholas Mata, male    DOB: 1976/06/07, 40 y.o.   MRN: [id number] Chief Complaint  Patient presents with   Wrist Pain    right wrist   Back Pain    When breathing, pt took amoxicillin, mucinex cough syrup and theraflu, has been having pain when breathing since this   HPI Nicholas PersonsLevon R Mata is a 40 y.o. male Here for back pain and wrist pain. He reports taking leftover amoxicillin from previous visit on 09/13/16, mucinex cough syrup and theraflu 3 days ago. Next morning (2 days ago), his neck felt puffy and swollen on the outside; proceeded to ER. Patient was treated 2 days ago in the ER for pharyngitis; negative strep testing was done at that time.   Left wrist pain  Patient fell forward after tripping over something about a week ago and believes he sprained his left wrist. He was getting better for 1~2 days, but in the last few days, his pain returned. He denies taking medications to alleviate the pain. He denies any previous injuries to the area. He is right hand dominant.   Back pain with breathing  After ER visit, patient's throat and neck have been feeling better. However, he is feeing upper back pain when he takes deep breaths. He describes his muscles becoming tight. He has a slight cough. He denies fever, shortness of breath, calf pain or calf swelling. He denies history of blood clots.   Patient Active Problem List   Diagnosis Date Noted   Benign essential HTN 06/07/2015   Former smoker 06/07/2015   Past Medical History:  Diagnosis Date   Asthma    Hypertension    No past surgical history on file. No Known Allergies Prior to Admission medications   Medication Sig Start Date End Date  Taking? Authorizing Provider  lisinopril (PRINIVIL,ZESTRIL) 10 MG tablet Take 1 tablet (10 mg total) by mouth daily. 09/13/16  Yes Ofilia NeasMichael L Clark, PA-C  amoxicillin (AMOXIL) 875 MG tablet Take 1 tablet (875 mg total) by mouth 2 (two) times daily. Patient not taking: Reported on 10/15/2016 09/13/16   Ofilia NeasMichael L Clark, PA-C   Social History   Social History   Marital status: Single    Spouse name: n/a   Number of children: 3   Years of education: 12th grade   Occupational History   truck driver     [employer]   Social History Main Topics   Smoking status: Former Smoker    Quit date: 07/14/2016   Smokeless tobacco: Never Used   Alcohol use No   Drug use: No   Sexual activity: No   Other Topics Concern   Not on file   Social History Narrative   Lives alone.   Children live locally with their mother, visit with him every other weekend.   Review of Systems  Constitutional: Negative for chills, fatigue, fever and unexpected weight change.  HENT: Negative for sore throat.   Eyes: Negative for visual disturbance.  Respiratory: Positive for cough. Negative for chest tightness and shortness of breath.   Cardiovascular: Negative for chest pain, palpitations and leg swelling.  Gastrointestinal: Negative for abdominal pain and blood in stool.  Musculoskeletal: Positive for  arthralgias and back pain. Negative for gait problem.  Neurological: Negative for dizziness, light-headedness and headaches.       Objective:   Physical Exam  Constitutional: He is oriented to person, place, and time. He appears well-developed and well-nourished.  HENT:  Head: Normocephalic and atraumatic.  Right Ear: Tympanic membrane, external ear and ear canal normal.  Left Ear: Tympanic membrane, external ear and ear canal normal.  Nose: No rhinorrhea.  Mouth/Throat: Uvula is midline, oropharynx is clear and moist and mucous membranes are normal. No uvula swelling. No oropharyngeal exudate or  posterior oropharyngeal erythema.  Eyes: Conjunctivae are normal. Pupils are equal, round, and reactive to light.  Neck: Neck supple.  Cardiovascular: Normal rate, regular rhythm, normal heart sounds and intact distal pulses.   No murmur heard. Pulmonary/Chest: Effort normal and breath sounds normal. He has no wheezes. He has no rhonchi. He has no rales.  Abdominal: Soft. There is no tenderness.  Musculoskeletal:  Pain in his latissimus muscle bilaterally, no bony tenderness, negative Homan's Left wrist: slight tenderness over DRUJ, no scaphoid tenderness, full ROM but pain with ulnar deviation extension  Lymphadenopathy:    He has no cervical adenopathy.  Neurological: He is alert and oriented to person, place, and time.  Skin: Skin is warm and dry. No rash noted.  Psychiatric: He has a normal mood and affect. His behavior is normal.  Vitals reviewed.   Vitals:   10/15/16 0809  BP: (!) 148/88  Pulse: 72  Resp: 16  Temp: 98.3 F (36.8 C)  TempSrc: Oral  SpO2: 98%  Weight: 161 lb 3.2 oz (73.1 kg)  Height: 5' 5.5" (1.664 m)   Dg Chest 2 View  Result Date: 10/15/2016 CLINICAL DATA:  40 year old male status post fall 1 week ago. Cough and chest tightness for 2 days Initial encounter. Initial encounter. EXAM: CHEST  2 VIEW COMPARISON:  Chest and abdominal series [id number]. FINDINGS: Normal lung volumes. Normal cardiac size and mediastinal contours. Visualized tracheal air column is within normal limits. The lungs are clear. No pneumothorax or pleural effusion. Incidental upper thoracic spina bifida occulta, T1 through T3 (normal variant). No acute osseous abnormality identified. Negative visible bowel gas pattern. IMPRESSION: Negative.  No acute cardiopulmonary abnormality. Electronically Signed   By: Odessa FlemingH  Hall M.D.   On: 10/15/2016 08:54   Dg Wrist Complete Left  Result Date: 10/15/2016 CLINICAL DATA:  40 year old male status post fall 1 week ago with persistent anterior wrist pain.  Initial encounter. EXAM: LEFT WRIST - COMPLETE 3+ VIEW COMPARISON:  None. FINDINGS: Bone mineralization is within normal limits. Distal left radius and ulna appear intact. Carpal bone alignment and joint spaces within normal limits. Scaphoid intact. Visible metacarpals intact. No acute osseous abnormality identified. IMPRESSION: No osseous abnormality identified about the left wrist. Electronically Signed   By: Odessa FlemingH  Hall M.D.   On: 10/15/2016 08:53       Assessment & Plan:   Nicholas Mata is a 40 y.o. male Upper back pain - Plan: DG Chest 2 View Cough - Plan: DG Chest 2 View  - Minimal cough, may be upper back strain versus spasm from sitting in hospital chair or secondary to cough. Lungs were clear, chest x-ray reassuring.  -Tylenol or NSAID if needed for symptomatic care, range of motion, heat, RTC precautions.  Left wrist sprain, initial encounter - Plan: DG Wrist Complete Left, Splint wrist  - Mild, initial improvement after injury, now with 5 discomfort.  -Wrist brace given, handout given, symptomatic  care discussed, RTC precautions.  No orders of the defined types were placed in this encounter.  Patient Instructions    You likely had a sprain of your wrist. See information on this below. You can use the wrist brace as needed, but try to come out of that at least once or twice a day for range of motion, and use that brace only as needed.   Upper back pain appears to be due to muscle pain, which may have been due to a strain with cough or other muscle issue. Over-the-counter Tylenol, Advil, or Aleve as needed. Heat or ice, and range of motion as tolerated.   If you have increasing shortness of breath, calf pain or swelling, or chest pain, be seen immediately.   Back Pain, Adult Back pain is very common in adults.The cause of back pain is rarely dangerous and the pain often gets better over time.The cause of your back pain may not be known. Some common causes of back pain  include:  Strain of the muscles or ligaments supporting the spine.  Wear and tear (degeneration) of the spinal disks.  Arthritis.  Direct injury to the back. For many people, back pain may return. Since back pain is rarely dangerous, most people can learn to manage this condition on their own. Follow these instructions at home: Watch your back pain for any changes. The following actions may help to lessen any discomfort you are feeling:  Remain active. It is stressful on your back to sit or stand in one place for long periods of time. Do not sit, drive, or stand in one place for more than 30 minutes at a time. Take short walks on even surfaces as soon as you are able.Try to increase the length of time you walk each day.  Exercise regularly as directed by your health care provider. Exercise helps your back heal faster. It also helps avoid future injury by keeping your muscles strong and flexible.  Do not stay in bed.Resting more than 1-2 days can delay your recovery.  Pay attention to your body when you bend and lift. The most comfortable positions are those that put less stress on your recovering back. Always use proper lifting techniques, including:  Bending your knees.  Keeping the load close to your body.  Avoiding twisting.  Find a comfortable position to sleep. Use a firm mattress and lie on your side with your knees slightly bent. If you lie on your back, put a pillow under your knees.  Avoid feeling anxious or stressed.Stress increases muscle tension and can worsen back pain.It is important to recognize when you are anxious or stressed and learn ways to manage it, such as with exercise.  Take medicines only as directed by your health care provider. Over-the-counter medicines to reduce pain and inflammation are often the most helpful.Your health care provider may prescribe muscle relaxant drugs.These medicines help dull your pain so you can more quickly return to your  normal activities and healthy exercise.  Apply ice to the injured area:  Put ice in a plastic bag.  Place a towel between your skin and the bag.  Leave the ice on for 20 minutes, 2-3 times a day for the first 2-3 days. After that, ice and heat may be alternated to reduce pain and spasms.  Maintain a healthy weight. Excess weight puts extra stress on your back and makes it difficult to maintain good posture. Contact a health care provider if:  You have pain that  is not relieved with rest or medicine.  You have increasing pain going down into the legs or buttocks.  You have pain that does not improve in one week.  You have night pain.  You lose weight.  You have a fever or chills. Get help right away if:  You develop new bowel or bladder control problems.  You have unusual weakness or numbness in your arms or legs.  You develop nausea or vomiting.  You develop abdominal pain.  You feel faint. This information is not intended to replace advice given to you by your health care provider. Make sure you discuss any questions you have with your health care provider. Document Released: 11/07/2005 Document Revised: 03/17/2016 Document Reviewed: 03/11/2014 Elsevier Interactive Patient Education  2017 Else[employer] Inc.  Wrist Sprain, Adult A wrist sprain is a stretch or tear in the strong, fibrous tissues (ligaments) that connect your wrist bones. There are three types of wrist sprains:  Grade 1. In this type of sprain, the ligament is stretched more than normal.  Grade 2. In this type of sprain, the ligament is partially torn. You may be able to move your wrist, but not very much.  Grade 3. In this type of sprain, the ligament or muscle is completely torn. You may find it difficult or extremely painful to move your wrist even a little. What are the causes? A wrist sprain can be caused by using the wrist too much during sports, exercise, or at work. It can also happen with a fall or  during an accident. What increases the risk? This condition is more likely to occur in people:  With a previous wrist or arm injury.  With poor wrist strength and flexibility.  Who play contact sports, such as football or soccer.  Who play sports that may result in a fall, such as skateboarding, biking, skiing, or snowboarding.  Who do not exercise regularly.  Who use exercise equipment that does not fit well. What are the signs or symptoms? Symptoms of this condition include:  Pain in the wrist, arm, or hand.  Swelling or bruised skin near the wrist, hand, or arm. The skin may look yellow or kind of blue.  Stiffness or trouble moving the hand.  Hearing a pop or feeling a tear at the time of the injury.  A warm feeling in the skin around the wrist. How is this diagnosed? This condition is diagnosed with a physical exam. Sometimes an X-ray is taken to make sure a bone did not break. If your health care provider thinks that you tore a ligament, he or she may order an MRI of your wrist. How is this treated? This condition is treated by resting and applying ice to your wrist. Additional treatment may include:  Medicine for pain and inflammation.  A splint to keep your wrist still (immobilized).  Exercises to strengthen and stretch your wrist.  Surgery. This may be done if the ligament is completely torn. Follow these instructions at home: If you have a splint:  Do not put pressure on any part of the splint until it is fully hardened. This may take several hours.  Wear the splint as told by your health care provider. Remove it only as told by your health care provider.  Loosen the splint if your fingers tingle, become numb, or turn cold and blue.  If your splint is not waterproof:  Do not let it get wet.  Cover it with a watertight covering when you take  a bath or a shower.  Keep the splint clean. Managing pain, stiffness, and swelling  If directed, put ice on the  injured area.  If you have a removable splint, remove it as told by your health care provider.  Put ice in a plastic bag.  Place a towel between your skin and the bag or between the splint and the bag.  Leave the ice on for 20 minutes, 2-3 times per day.  Move your fingers often to avoid stiffness and to lessen swelling.  Raise (elevate) the injured area above the level of your heart while you are sitting or lying down. Activity  Rest your wrist. Do not do things that cause pain.  Return to your normal activities as told by your health care provider. Ask your health care provider what activities are safe for you.  Do exercises as told by your health care provider. General instructions  Take over-the-counter and prescription medicines only as told by your health care provider.  Do not use any products that contain nicotine or tobacco, such as cigarettes and e-cigarettes. These can delay healing. If you need help quitting, ask your health care provider.  Ask your health care provider when it is safe to drive if you have a splint.  Keep all follow-up visits as told by your health care provider. This is important. Contact a health care provider if:  Your pain, bruising, or swelling gets worse.  Your skin becomes red, gets a rash, or has open sores.  Your pain does not get better or it gets worse. Get help right away if:  You have a new or sudden sharp pain in the hand, arm, or wrist.  You have tingling or numbness in your hand.  Your fingers turn white, very red, or cold and blue.  You cannot move your fingers. This information is not intended to replace advice given to you by your health care provider. Make sure you discuss any questions you have with your health care provider. Document Released: 07/11/2014 Document Revised: 06/04/2016 Document Reviewed: 05/26/2016 Elsevier Interactive Patient Education  2017 [employer].   IF you received an x-ray today, you will  receive an invoice from [facility]. Please contact [facility] at [phone] with questions or concerns regarding your invoice.   IF you received labwork today, you will receive an invoice from [employer]. Please contact [facility] at [phone] with questions or concerns regarding your invoice.   Our billing staff will not be able to assist you with questions regarding bills from these companies.  You will be contacted with the lab results as soon as they are available. The fastest way to get your results is to activate your My Chart account. Instructions are located on the last page of this paperwork. If you have not heard from [place] regarding the results in 2 weeks, please contact this office.

## 2017-02-22 ENCOUNTER — Ambulatory Visit (INDEPENDENT_AMBULATORY_CARE_PROVIDER_SITE_OTHER): Payer: 59 | Admitting: Urgent Care

## 2017-02-22 VITALS — BP 150/85 | HR 59 | Temp 98.6°F | Resp 16 | Ht 65.5 in | Wt 166.2 lb

## 2017-02-22 DIAGNOSIS — Z0000 Encounter for general adult medical examination without abnormal findings: Secondary | ICD-10-CM | POA: Diagnosis not present

## 2017-02-22 DIAGNOSIS — R350 Frequency of micturition: Secondary | ICD-10-CM

## 2017-02-22 DIAGNOSIS — R0602 Shortness of breath: Secondary | ICD-10-CM | POA: Diagnosis not present

## 2017-02-22 DIAGNOSIS — R3915 Urgency of urination: Secondary | ICD-10-CM

## 2017-02-22 DIAGNOSIS — I10 Essential (primary) hypertension: Secondary | ICD-10-CM

## 2017-02-22 DIAGNOSIS — Z8249 Family history of ischemic heart disease and other diseases of the circulatory system: Secondary | ICD-10-CM

## 2017-02-22 DIAGNOSIS — R1084 Generalized abdominal pain: Secondary | ICD-10-CM | POA: Diagnosis not present

## 2017-02-22 DIAGNOSIS — Z113 Encounter for screening for infections with a predominantly sexual mode of transmission: Secondary | ICD-10-CM

## 2017-02-22 DIAGNOSIS — R030 Elevated blood-pressure reading, without diagnosis of hypertension: Secondary | ICD-10-CM

## 2017-02-22 DIAGNOSIS — Z833 Family history of diabetes mellitus: Secondary | ICD-10-CM

## 2017-02-22 MED ORDER — AMLODIPINE BESYLATE 5 MG PO TABS: 5.0000 mg | ORAL_TABLET | Freq: Every day | ORAL | 0 refills | Status: DC

## 2017-02-22 NOTE — Progress Notes (Signed)
MRN: [id number]  Subjective:   [name] is a 41 y.o. male presenting for annual physical exam. Patient is single, has 3 children. Has good relationships at home, has a good support network. He is agreeable to STI testing except HIV. Denies smoking cigarettes. Has ~5 drinks of alcohol per week. Tries to eat healthily, avoids salt in his diet. Drinks 3-5 cups of coffee per day. Tries to hydrate very well, drinks more than 2 liters of water daily. Stays very active with his work, works as a Research officer, trade [employer].   Medical care team includes: PCP: Philis Fendt, MD Vision: No visual deficits. Dental: Gets dental cleanings every 6 months. Specialists: None.  Birt has a current medication list which includes the following prescription(s): lisinopril. He has No Known Allergies. Ladarrious  has a past medical history of Asthma and Hypertension. Also denies past surgical history. His family history includes Diabetes in his brother and brother; Eczema in his daughter; Hypertension in his brother, brother, and mother.  Immunizations: Tdap is up to date.   Review of Systems  Constitutional: Positive for malaise/fatigue. Negative for chills, diaphoresis, fever and weight loss.  HENT: Negative for congestion, ear discharge, ear pain, hearing loss, nosebleeds, sore throat and tinnitus.   Eyes: Negative for blurred vision, double vision, photophobia, pain, discharge and redness.  Respiratory: Positive for shortness of breath. Negative for cough and wheezing.   Cardiovascular: Negative for chest pain, palpitations and leg swelling.  Gastrointestinal: Positive for abdominal pain. Negative for blood in stool, constipation, diarrhea, nausea and vomiting.  Genitourinary: Positive for frequency and urgency. Negative for dysuria, flank pain and hematuria.  Musculoskeletal: Negative for back pain, joint pain and myalgias.  Skin: Negative for itching and rash.  Neurological: Negative for  dizziness, tingling, seizures, loss of consciousness, weakness and headaches.  Endo/Heme/Allergies: Negative for polydipsia.  Psychiatric/Behavioral: Negative for depression, hallucinations, memory loss, substance abuse and suicidal ideas. The patient is not nervous/anxious and does not have insomnia.    Objective:   Vitals: BP (!) 150/85 (BP Location: Right Arm, Patient Position: Sitting, Cuff Size: Large)   Pulse (!) 59   Temp 98.6 F (37 C) (Oral)   Resp 16   Ht 5' 5.5" (1.664 m)   Wt 166 lb 3.2 oz (75.4 kg)   SpO2 99%   BMI 27.24 kg/m   BP Readings from Last 3 Encounters:  02/22/17 (!) 150/85  10/15/16 (!) 148/88  10/13/16 164/100    Physical Exam  Constitutional: He is oriented to person, place, and time. He appears well-developed and well-nourished.  HENT:  TM's intact bilaterally, no effusions or erythema. Nasal turbinates pink and moist, nasal passages patent. No sinus tenderness. Oropharynx clear, mucous membranes moist, dentition in good repair.  Eyes: Conjunctivae and EOM are normal. Pupils are equal, round, and reactive to light. Right eye exhibits no discharge. Left eye exhibits no discharge. No scleral icterus.  Neck: Normal range of motion. Neck supple. No thyromegaly present.  Cardiovascular: Normal rate, regular rhythm and intact distal pulses.  Exam reveals no gallop and no friction rub.   No murmur heard. Pulmonary/Chest: No stridor. No respiratory distress. He has no wheezes. He has no rales.  Abdominal: Soft. Bowel sounds are normal. He exhibits no distension and no mass. There is no tenderness.  Musculoskeletal: Normal range of motion. He exhibits no edema or tenderness.  Lymphadenopathy:    He has no cervical adenopathy.  Neurological: He is alert and oriented to person,  place, and time. He has normal reflexes.  Skin: Skin is warm and dry. No rash noted. No erythema. No pallor.  Psychiatric: He has a normal mood and affect.   Assessment and Plan :    1. Annual physical exam - Medically stable, labs pending. Discussed healthy lifestyle, diet, exercise, preventative care, vaccinations, and addressed patient's concerns.  - CBC with Differential/Platelet - CMP14+EGFR - TSH - Lipid panel  2. Elevated blood pressure reading 3. Essential hypertension - Recommended he maintain healthy diet and active lifestyle. Will have patient start amlodipine given uncontrolled HTN. Recheck in 4 weeks.  4. Routine screening for STI (sexually transmitted infection) - Labs pending - RPR - GC/Chlamydia Probe Amp - Trichomonas vaginalis, RNA  5. Family history of diabetes mellitus 6. Family history of hypertension - Labs pending  7. Shortness of breath 8. Generalized abdominal pain - Labs pending, patient refused an office visit today to work up these symptoms since he did not want to do a co-pay. Will screen through annual exam, follow up as above. Vitals are reassuring except for blood pressure which will be managed as above with #2,3 and #9,10  9. Urinary frequency 10. Urinary urgency - Labs pending, recommended patient cut back on his coffee use as it can have a diuretic effect.   Jaynee Eagles, PA-C [facility] [place] [phone] 02/22/2017  12:52 PM

## 2017-02-22 NOTE — Patient Instructions (Addendum)
Keeping you healthy  Get these tests  Blood pressure- Have your blood pressure checked once a year by your healthcare provider.  Normal blood pressure is 120/80.  Weight- Have your body mass index (BMI) calculated to screen for obesity.  BMI is a measure of body fat based on height and weight. You can also calculate your own BMI at [email or web address].west-esparza.com/.  Cholesterol- Have your cholesterol checked regularly starting at age 40, sooner may be necessary if you have diabetes, high blood pressure, if a family member developed heart diseases at an early age or if you smoke.   Chlamydia, HIV, and other sexual transmitted disease- Get screened each year until the age of 49 then within three months of each new sexual partner.  Diabetes- Have your blood sugar checked regularly if you have high blood pressure, high cholesterol, a family history of diabetes or if you are overweight.  Get these vaccines  Flu shot- Every fall.  Tetanus shot- Every 10 years.  Menactra- Single dose; prevents meningitis.  Take these steps  Don't smoke- If you do smoke, ask your healthcare provider about quitting. For tips on how to quit, go to www.smokefree.gov or call 1-800-QUIT-NOW.  Be physically active- Exercise 5 days a week for at least 30 minutes.  If you are not already physically active start slow and gradually work up to 30 minutes of moderate physical activity.  Examples of moderate activity include walking briskly, mowing the yard, dancing, swimming bicycling, etc.  Eat a healthy diet- Eat a variety of healthy foods such as fruits, vegetables, low fat milk, low fat cheese, yogurt, lean meats, poultry, fish, beans, tofu, etc.  For more information on healthy eating, go to www.thenutritionsource.org  Drink alcohol in moderation- Limit alcohol intake two drinks or less a day.  Never drink and drive.  Dentist- Brush and floss teeth twice daily; visit your dentis twice a year.  Depression-Your emotional  health is as important as your physical health.  If you're feeling down, losing interest in things you normally enjoy please talk with your healthcare provider.  Gun Safety- If you keep a gun in your home, keep it unloaded and with the safety lock on.  Bullets should be stored separately.  Helmet use- Always wear a helmet when riding a motorcycle, bicycle, rollerblading or skateboarding.  Safe sex- If you may be exposed to a sexually transmitted infection, use a condom  Seat belts- Seat bels can save your life; always wear one.  Smoke/Carbon Monoxide detectors- These detectors need to be installed on the appropriate level of your home.  Replace batteries at least once a year.  Skin Cancer- When out in the sun, cover up and use sunscreen SPF 15 or higher.  Violence- If anyone is threatening or hurting you, please tell your healthcare provider.     Hypertension Hypertension, commonly called high blood pressure, is when the force of blood pumping through the arteries is too strong. The arteries are the blood vessels that carry blood from the heart throughout the body. Hypertension forces the heart to work harder to pump blood and may cause arteries to become narrow or stiff. Having untreated or uncontrolled hypertension can cause heart attacks, strokes, kidney disease, and other problems. A blood pressure reading consists of a higher number over a lower number. Ideally, your blood pressure should be below 120/80. The first ("top") number is called the systolic pressure. It is a measure of the pressure in your arteries as your heart beats. The second ("bottom")  number is called the diastolic pressure. It is a measure of the pressure in your arteries as the heart relaxes. What are the causes? The cause of this condition is not known. What increases the risk? Some risk factors for high blood pressure are under your control. Others are not. Factors you can change   Smoking.  Having type 2  diabetes mellitus, high cholesterol, or both.  Not getting enough exercise or physical activity.  Being overweight.  Having too much fat, sugar, calories, or salt (sodium) in your diet.  Drinking too much alcohol. Factors that are difficult or impossible to change   Having chronic kidney disease.  Having a family history of high blood pressure.  Age. Risk increases with age.  Race. You may be at higher risk if you are African-American.  Gender. Men are at higher risk than women before age 87. After age 30, women are at higher risk than men.  Having obstructive sleep apnea.  Stress. What are the signs or symptoms? Extremely high blood pressure (hypertensive crisis) may cause:  Headache.  Anxiety.  Shortness of breath.  Nosebleed.  Nausea and vomiting.  Severe chest pain.  Jerky movements you cannot control (seizures). How is this diagnosed? This condition is diagnosed by measuring your blood pressure while you are seated, with your arm resting on a surface. The cuff of the blood pressure monitor will be placed directly against the skin of your upper arm at the level of your heart. It should be measured at least twice using the same arm. Certain conditions can cause a difference in blood pressure between your right and left arms. Certain factors can cause blood pressure readings to be lower or higher than normal (elevated) for a short period of time:  When your blood pressure is higher when you are in a health care provider's office than when you are at home, this is called white coat hypertension. Most people with this condition do not need medicines.  When your blood pressure is higher at home than when you are in a health care provider's office, this is called masked hypertension. Most people with this condition may need medicines to control blood pressure. If you have a high blood pressure reading during one visit or you have normal blood pressure with other risk  factors:  You may be asked to return on a different day to have your blood pressure checked again.  You may be asked to monitor your blood pressure at home for 1 week or longer. If you are diagnosed with hypertension, you may have other blood or imaging tests to help your health care provider understand your overall risk for other conditions. How is this treated? This condition is treated by making healthy lifestyle changes, such as eating healthy foods, exercising more, and reducing your alcohol intake. Your health care provider may prescribe medicine if lifestyle changes are not enough to get your blood pressure under control, and if:  Your systolic blood pressure is above 130.  Your diastolic blood pressure is above 80. Your personal target blood pressure may vary depending on your medical conditions, your age, and other factors. Follow these instructions at home: Eating and drinking   Eat a diet that is high in fiber and potassium, and low in sodium, added sugar, and fat. An example eating plan is called the DASH (Dietary Approaches to Stop Hypertension) diet. To eat this way:  Eat plenty of fresh fruits and vegetables. Try to fill half of your plate at  each meal with fruits and vegetables.  Eat whole grains, such as whole wheat pasta, brown rice, or whole grain bread. Fill about one quarter of your plate with whole grains.  Eat or drink low-fat dairy products, such as skim milk or low-fat yogurt.  Avoid fatty cuts of meat, processed or cured meats, and poultry with skin. Fill about one quarter of your plate with lean proteins, such as fish, chicken without skin, beans, eggs, and tofu.  Avoid premade and processed foods. These tend to be higher in sodium, added sugar, and fat.  Reduce your daily sodium intake. Most people with hypertension should eat less than 1,500 mg of sodium a day.  Limit alcohol intake to no more than 1 drink a day for nonpregnant women and 2 drinks a day for  men. One drink equals 12 oz of beer, 5 oz of wine, or 1 oz of hard liquor. Lifestyle   Work with your health care provider to maintain a healthy body weight or to lose weight. Ask what an ideal weight is for you.  Get at least 30 minutes of exercise that causes your heart to beat faster (aerobic exercise) most days of the week. Activities may include walking, swimming, or biking.  Include exercise to strengthen your muscles (resistance exercise), such as pilates or lifting weights, as part of your weekly exercise routine. Try to do these types of exercises for 30 minutes at least 3 days a week.  Do not use any products that contain nicotine or tobacco, such as cigarettes and e-cigarettes. If you need help quitting, ask your health care provider.  Monitor your blood pressure at home as told by your health care provider.  Keep all follow-up visits as told by your health care provider. This is important. Medicines   Take over-the-counter and prescription medicines only as told by your health care provider. Follow directions carefully. Blood pressure medicines must be taken as prescribed.  Do not skip doses of blood pressure medicine. Doing this puts you at risk for problems and can make the medicine less effective.  Ask your health care provider about side effects or reactions to medicines that you should watch for. Contact a health care provider if:  You think you are having a reaction to a medicine you are taking.  You have headaches that keep coming back (recurring).  You feel dizzy.  You have swelling in your ankles.  You have trouble with your vision. Get help right away if:  You develop a severe headache or confusion.  You have unusual weakness or numbness.  You feel faint.  You have severe pain in your chest or abdomen.  You vomit repeatedly.  You have trouble breathing. Summary  Hypertension is when the force of blood pumping through your arteries is too strong.  If this condition is not controlled, it may put you at risk for serious complications.  Your personal target blood pressure may vary depending on your medical conditions, your age, and other factors. For most people, a normal blood pressure is less than 120/80.  Hypertension is treated with lifestyle changes, medicines, or a combination of both. Lifestyle changes include weight loss, eating a healthy, low-sodium diet, exercising more, and limiting alcohol. This information is not intended to replace advice given to you by your health care provider. Make sure you discuss any questions you have with your health care provider. Document Released: 11/07/2005 Document Revised: 10/05/2016 Document Reviewed: 10/05/2016 Elsevier Interactive Patient Education  2017 [employer].  IF you received an x-ray today, you will receive an invoice from [facility]. Please contact [facility] at [phone] with questions or concerns regarding your invoice.   IF you received labwork today, you will receive an invoice from [facility]. Please contact [facility] at [phone] with questions or concerns regarding your invoice.   Our billing staff will not be able to assist you with questions regarding bills from these companies.  You will be contacted with the lab results as soon as they are available. The fastest way to get your results is to activate your My Chart account. Instructions are located on the last page of this paperwork. If you have not heard from us regarding the results in 2 weeks, please contact this office.      

## 2017-02-23 LAB — TSH: TSH: 0.862 u[IU]/mL (ref 0.450–4.500)

## 2017-02-23 LAB — CMP14+EGFR
ALBUMIN: 4.4 g/dL (ref 3.5–5.5)
ALT: 36 IU/L (ref 0–44)
AST: 36 IU/L (ref 0–40)
Albumin/Globulin Ratio: 1.6 (ref 1.2–2.2)
Alkaline Phosphatase: 58 IU/L (ref 39–117)
BILIRUBIN TOTAL: 0.3 mg/dL (ref 0.0–1.2)
BUN / CREAT RATIO: 16 (ref 9–20)
BUN: 16 mg/dL (ref 6–24)
CHLORIDE: 102 mmol/L (ref 96–106)
CO2: 23 mmol/L (ref 18–29)
CREATININE: 1 mg/dL (ref 0.76–1.27)
Calcium: 9.5 mg/dL (ref 8.7–10.2)
GFR calc non Af Amer: 94 mL/min/{1.73_m2} (ref >59)
GFR, EST AFRICAN AMERICAN: 108 mL/min/{1.73_m2} (ref >59)
GLUCOSE: 83 mg/dL (ref 65–99)
Globulin, Total: 2.8 g/dL (ref 1.5–4.5)
Potassium: 4.6 mmol/L (ref 3.5–5.2)
Sodium: 141 mmol/L (ref 134–144)
TOTAL PROTEIN: 7.2 g/dL (ref 6.0–8.5)

## 2017-02-23 LAB — CBC WITH DIFFERENTIAL/PLATELET
BASOS ABS: 0 10*3/uL (ref 0.0–0.2)
Basos: 1 %
EOS (ABSOLUTE): 0.2 10*3/uL (ref 0.0–0.4)
Eos: 3 %
HEMOGLOBIN: 14.7 g/dL (ref 13.0–17.7)
Hematocrit: 43.4 % (ref 37.5–51.0)
IMMATURE GRANS (ABS): 0 10*3/uL (ref 0.0–0.1)
IMMATURE GRANULOCYTES: 0 %
LYMPHS: 40 %
Lymphocytes Absolute: 2.5 10*3/uL (ref 0.7–3.1)
MCH: 31.5 pg (ref 26.6–33.0)
MCHC: 33.9 g/dL (ref 31.5–35.7)
MCV: 93 fL (ref 79–97)
MONOCYTES: 9 %
Monocytes Absolute: 0.6 10*3/uL (ref 0.1–0.9)
NEUTROS PCT: 47 %
Neutrophils Absolute: 3 10*3/uL (ref 1.4–7.0)
PLATELETS: 246 10*3/uL (ref 150–379)
RBC: 4.67 x10E6/uL (ref 4.14–5.80)
RDW: 14.1 % (ref 12.3–15.4)
WBC: 6.3 10*3/uL (ref 3.4–10.8)

## 2017-02-23 LAB — HEMOGLOBIN A1C
Est. average glucose Bld gHb Est-mCnc: 108 mg/dL
HEMOGLOBIN A1C: 5.4 % (ref 4.8–5.6)

## 2017-02-23 LAB — LIPID PANEL
CHOL/HDL RATIO: 3 ratio (ref 0.0–5.0)
Cholesterol, Total: 119 mg/dL (ref 100–199)
HDL: 40 mg/dL (ref >39)
LDL Calculated: 65 mg/dL (ref 0–99)
TRIGLYCERIDES: 69 mg/dL (ref 0–149)
VLDL Cholesterol Cal: 14 mg/dL (ref 5–40)

## 2017-02-23 LAB — GC/CHLAMYDIA PROBE AMP
Chlamydia trachomatis, NAA: NEGATIVE
NEISSERIA GONORRHOEAE BY PCR: NEGATIVE

## 2017-02-23 LAB — RPR: RPR: NONREACTIVE

## 2017-02-24 ENCOUNTER — Telehealth: Payer: Self-pay | Admitting: Family Medicine

## 2017-02-24 LAB — TRICHOMONAS VAGINALIS, PROBE AMP: TRICH VAG BY NAA: NEGATIVE

## 2017-02-24 NOTE — Telephone Encounter (Signed)
Pt calling about labs °

## 2017-02-24 NOTE — Telephone Encounter (Signed)
Please report labs were unremarkable including normal electrolytes, kidney function, liver enzymes, cholesterol, thyroid function, blood cell counts. Also, STI testing was negative. Thank you!

## 2017-02-24 NOTE — Telephone Encounter (Signed)
All looks normal, correct?

## 2017-02-25 NOTE — Telephone Encounter (Signed)
Pt advised.

## 2017-08-18 ENCOUNTER — Other Ambulatory Visit: Payer: Self-pay | Admitting: Urgent Care

## 2017-08-18 DIAGNOSIS — I10 Essential (primary) hypertension: Secondary | ICD-10-CM

## 2017-08-20 ENCOUNTER — Other Ambulatory Visit: Payer: Self-pay | Admitting: Physician Assistant

## 2017-08-20 DIAGNOSIS — I10 Essential (primary) hypertension: Secondary | ICD-10-CM

## 2017-08-22 NOTE — Telephone Encounter (Signed)
mychart message sent to pt about making an apt °

## 2017-10-15 ENCOUNTER — Other Ambulatory Visit: Payer: Self-pay | Admitting: Urgent Care

## 2017-10-15 DIAGNOSIS — I10 Essential (primary) hypertension: Secondary | ICD-10-CM

## 2018-01-31 IMAGING — DX DG CHEST 2V
2 series · 2 of 2 positions shown · non-contrast
Comparison: Chest and abdominal series 41417.

CLINICAL DATA: 40-year-old male status post fall 1 week ago. Cough
and chest tightness for 2 days Initial encounter. Initial encounter.

EXAM:
CHEST  2 VIEW

[chest pa]
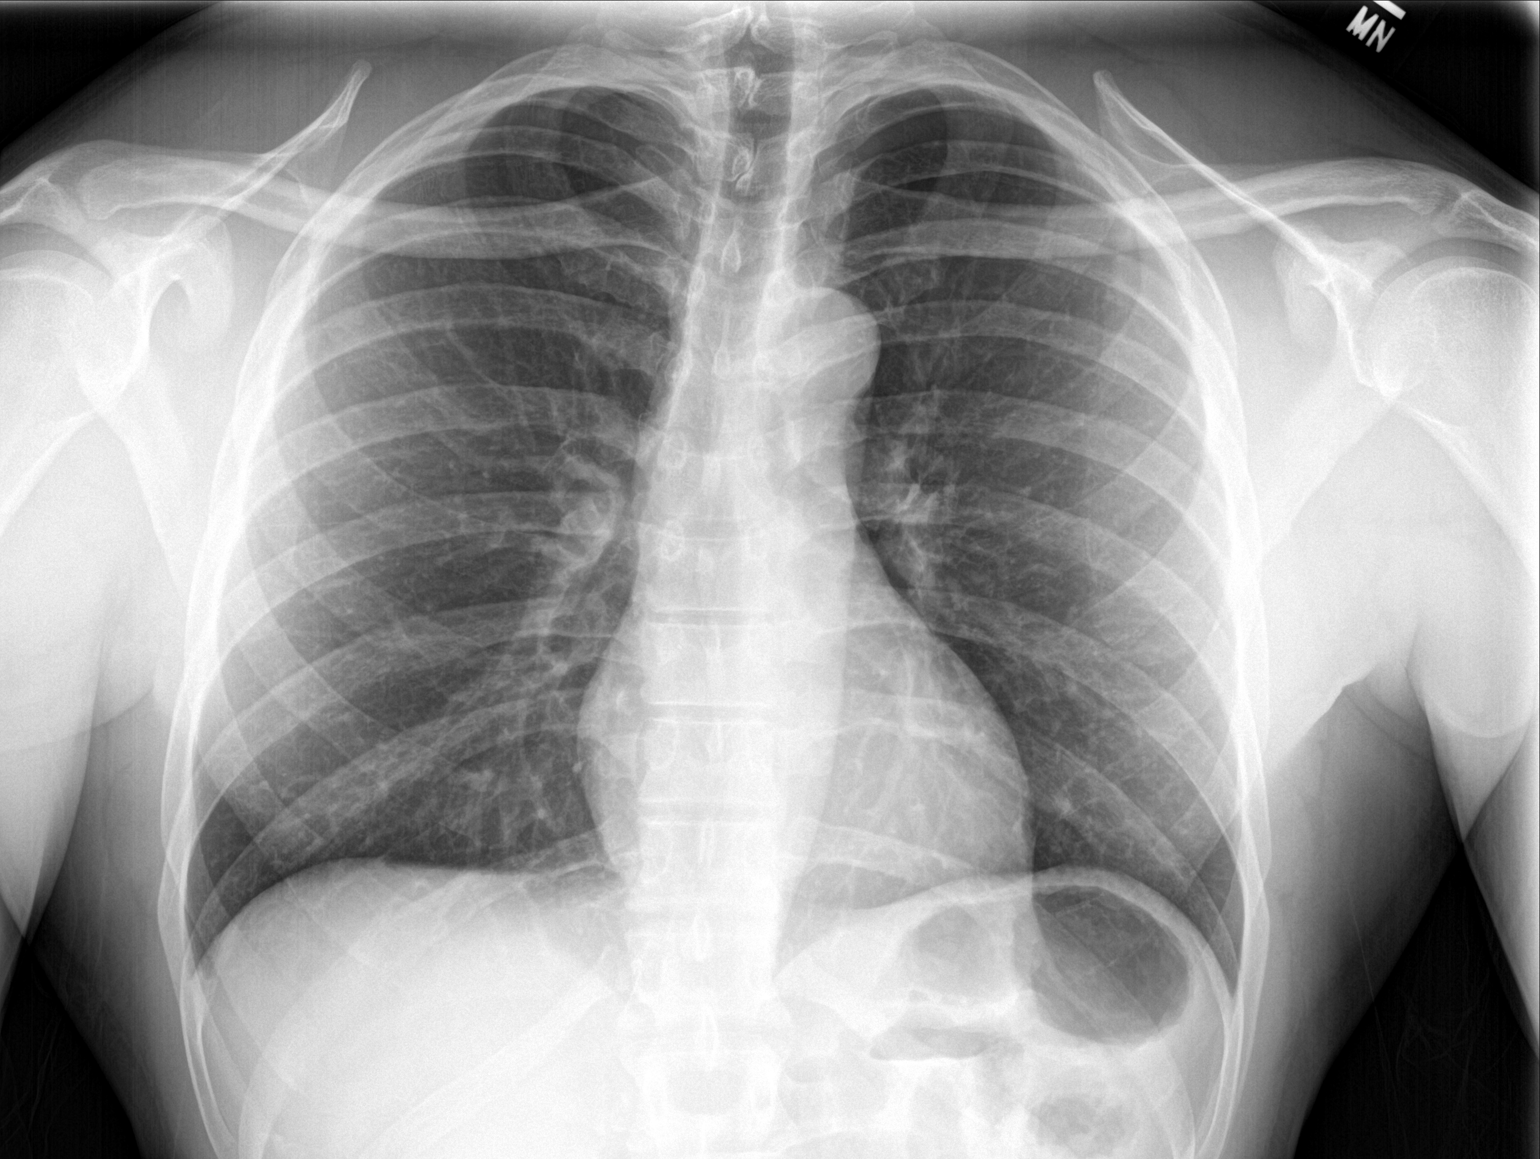

[chest lat]
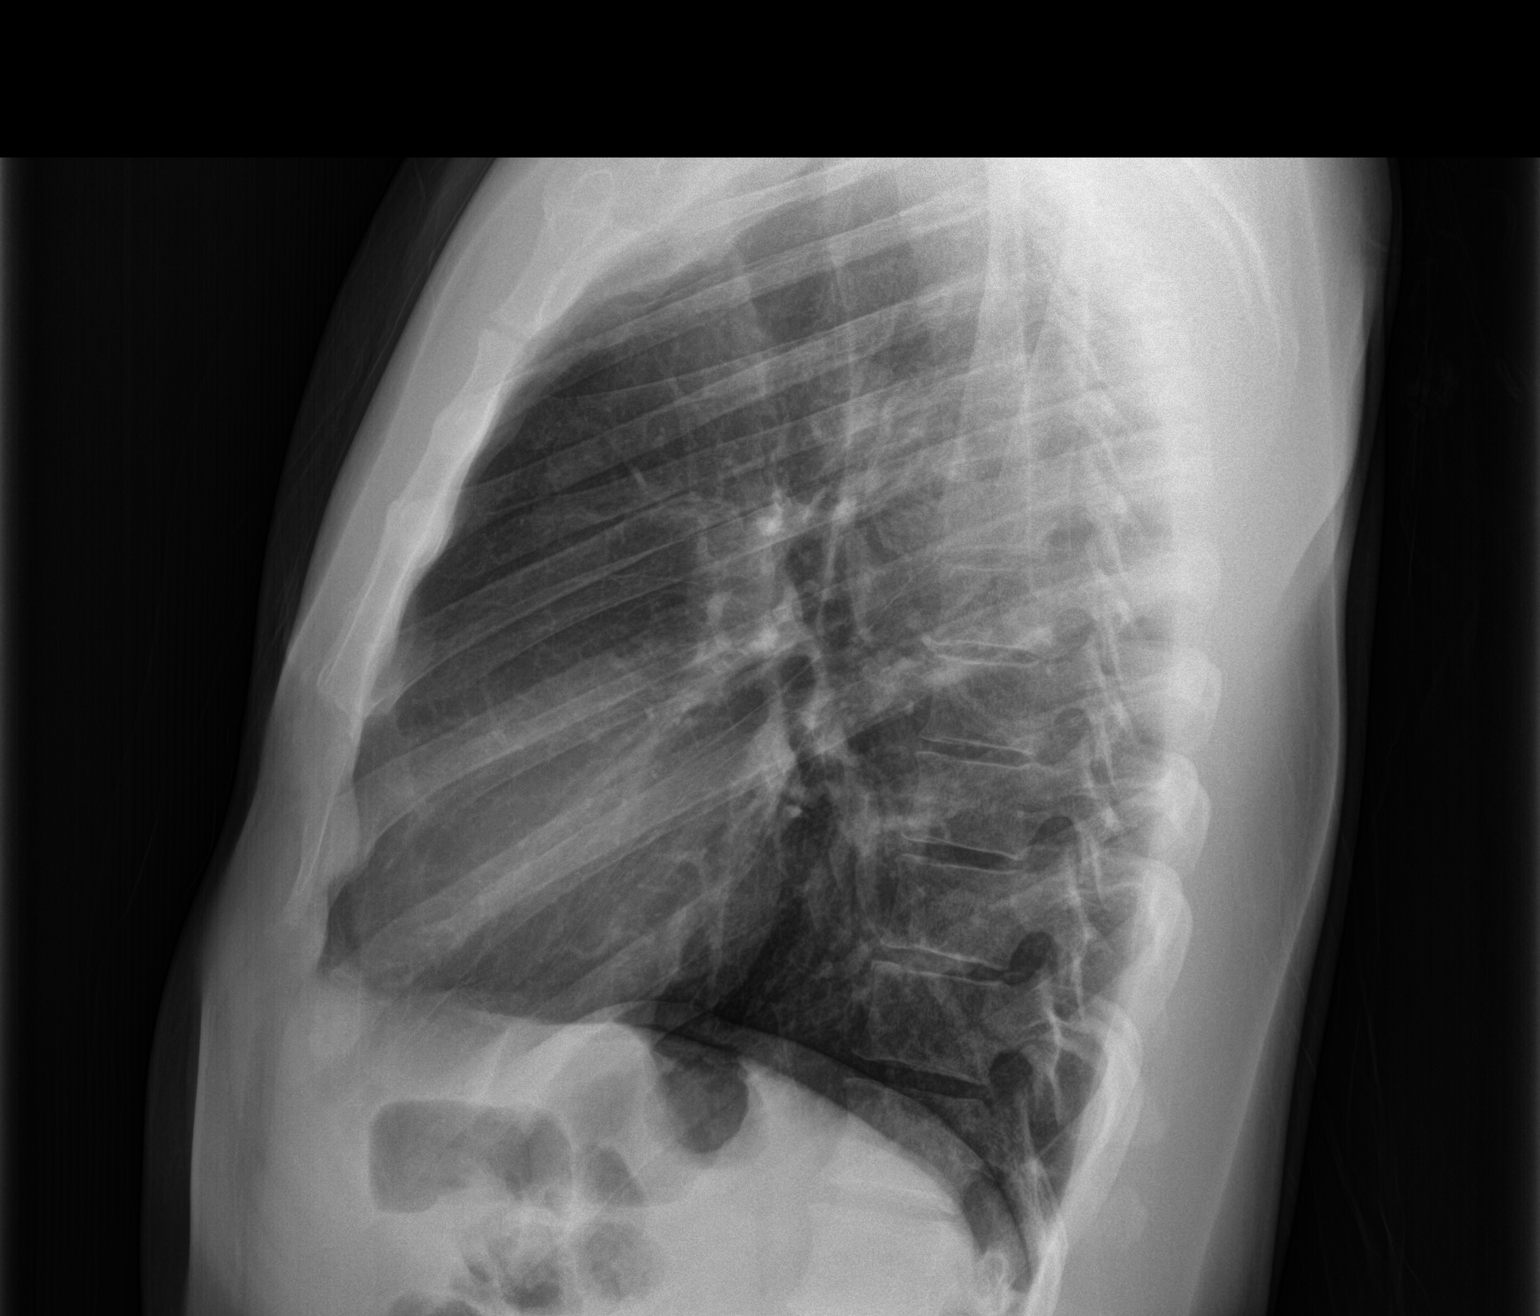

[2 of 2 positions shown; findings below may reference images not displayed]

FINDINGS: Normal lung volumes. Normal cardiac size and mediastinal contours.
Visualized tracheal air column is within normal limits. The lungs
are clear. No pneumothorax or pleural effusion.

Incidental upper thoracic spina bifida occulta, T1 through T3
(normal variant). No acute osseous abnormality identified. Negative
visible bowel gas pattern.
IMPRESSION: Negative.  No acute cardiopulmonary abnormality.

## 2020-02-24 ENCOUNTER — Ambulatory Visit: Payer: Self-pay | Attending: Internal Medicine

## 2020-02-24 DIAGNOSIS — Z23 Encounter for immunization: Secondary | ICD-10-CM

## 2020-02-24 NOTE — Progress Notes (Signed)
   Covid-[facility] Vaccination Clinic  Name:  Nicholas Mata    MRN: [id number] DOB: 1976-09-29  02/24/2020  [name] was observed post Covid-19 immunization for 15 minutes without incident. He was provided with Vaccine Information Sheet and instruction to access the V-Safe system.   [name] was instructed to call 911 with any severe reactions post vaccine: Marland Kitchen Difficulty breathing  . Swelling of face and throat  . A fast heartbeat  . A bad rash all over body  . Dizziness and weakness   Immunizations Administered    Name Date Dose VIS Date Route   Pfizer COVID-19 Vaccine 02/24/2020  9:06 AM 0.3 mL 11/01/2019 Intramuscular   Manufacturer: [employer], [employer]   Lot: [id number]   NDC: [address]-1

## 2020-03-24 ENCOUNTER — Ambulatory Visit: Payer: Self-pay | Attending: Internal Medicine

## 2020-03-24 DIAGNOSIS — Z23 Encounter for immunization: Secondary | ICD-10-CM

## 2020-03-24 NOTE — Progress Notes (Signed)
   Covid-19 Vaccination Clinic  Name:  Nicholas Mata    MRN: [id number] DOB: [birth year]  03/24/2020  [name] was observed post Covid-19 immunization for 15 minutes without incident. He was provided with Vaccine Information Sheet and instruction to access the V-Safe system.   [name] was instructed to call 911 with any severe reactions post vaccine: Marland Kitchen Difficulty breathing  . Swelling of face and throat  . A fast heartbeat  . A bad rash all over body  . Dizziness and weakness   Immunizations Administered    Name Date Dose VIS Date Route   Pfizer COVID-19 Vaccine 03/24/2020  8:36 AM 0.3 mL 01/15/2019 Intramuscular   Manufacturer: [employer], [employer]   Lot: [id number]   NDC: [address]-4
